# Patient Record
Sex: Male | Born: 1995
Health system: Southern US, Community
[De-identification: ages and names within clinical notes are randomized; demographics above are authoritative.]

## PROBLEM LIST (undated history)

## (undated) DIAGNOSIS — F419 Anxiety disorder, unspecified: Secondary | ICD-10-CM

---
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
## 2018-03-23 ENCOUNTER — Emergency Department (HOSPITAL_COMMUNITY)
Admission: EM | Admit: 2018-03-23 | Discharge: 2018-03-23 | Disposition: A | Discharge status: Home / Self Care | Payer: Self-pay | Attending: Emergency Medicine | Admitting: Emergency Medicine

## 2018-03-23 ENCOUNTER — Encounter (HOSPITAL_COMMUNITY): Payer: Self-pay | Admitting: Emergency Medicine

## 2018-03-23 ENCOUNTER — Other Ambulatory Visit: Payer: Self-pay

## 2018-03-23 DIAGNOSIS — F17200 Nicotine dependence, unspecified, uncomplicated: Secondary | ICD-10-CM | POA: Insufficient documentation

## 2018-03-23 DIAGNOSIS — Y999 Unspecified external cause status: Secondary | ICD-10-CM | POA: Insufficient documentation

## 2018-03-23 DIAGNOSIS — S50862A Insect bite (nonvenomous) of left forearm, initial encounter: Secondary | ICD-10-CM | POA: Insufficient documentation

## 2018-03-23 DIAGNOSIS — W57XXXA Bitten or stung by nonvenomous insect and other nonvenomous arthropods, initial encounter: Secondary | ICD-10-CM | POA: Insufficient documentation

## 2018-03-23 DIAGNOSIS — Y939 Activity, unspecified: Secondary | ICD-10-CM | POA: Insufficient documentation

## 2018-03-23 DIAGNOSIS — Y929 Unspecified place or not applicable: Secondary | ICD-10-CM | POA: Insufficient documentation

## 2018-03-23 DIAGNOSIS — Z79899 Other long term (current) drug therapy: Secondary | ICD-10-CM | POA: Insufficient documentation

## 2018-03-23 MED ORDER — DIPHENHYDRAMINE HCL 25 MG PO CAPS
25.0000 mg | ORAL_CAPSULE | Freq: Once | ORAL | Status: AC
  Administered 2018-03-23: 25 mg via ORAL
  Filled 2018-03-23: qty 1

## 2018-03-23 MED ORDER — DOXYCYCLINE HYCLATE 100 MG PO CAPS: 100.0000 mg | ORAL_CAPSULE | Freq: Two times a day (BID) | ORAL | 0 refills | Status: AC

## 2018-03-23 MED ORDER — DIPHENHYDRAMINE HCL 25 MG PO TABS: 25.0000 mg | ORAL_TABLET | Freq: Four times a day (QID) | ORAL | 0 refills | Status: AC

## 2018-03-23 NOTE — ED Provider Notes (Signed)
[facility] EMERGENCY DEPARTMENT Provider Note   CSN: [id number] Arrival date & time: 03/23/18  0957     History   Chief Complaint Chief Complaint  Patient presents with  . Insect Bite    HPI Erik Zimmerman is a 22 y.o. male who presents with a insect bite on the left forearm.  Past medical history significant for anxiety.  He states that he first noticed the bite mark last night.  He thinks it may be a spider bite but did not see any thing bite him.  Today he noticed that his arm was swollen and red.  It is painful and itchy.  The pain is starting to radiate up his arm.  He denies fever or difficulty moving his arm.  HPI  Past Medical History:  Diagnosis Date  . Anxiety     Patient Active Problem List   Diagnosis Date Noted  . MDD (major depressive disorder), single episode, severe (HCC) 03/26/2013  . ODD (oppositional defiant disorder) 03/26/2013  . ADHD (attention deficit hyperactivity disorder), combined type 03/26/2013  . Polysubstance abuse (HCC) 03/26/2013  . Unresolved grief 03/26/2013  . Suicidal ideation 03/26/2013    History reviewed. No pertinent surgical history.      Home Medications    Prior to Admission medications   Medication Sig Start Date End Date Taking? Authorizing Provider  diphenhydrAMINE (BENADRYL) 25 MG tablet Take 1 tablet (25 mg total) by mouth every 6 (six) hours. 03/23/18   Bethel Born, PA-C  doxycycline (VIBRAMYCIN) 100 MG capsule Take 1 capsule (100 mg total) by mouth 2 (two) times daily. 03/23/18   Bethel Born, PA-C  methylphenidate (CONCERTA) 54 MG CR tablet Take 1 tablet (54 mg total) by mouth daily. 03/31/13   Winson, Louie Bun, NP  mirtazapine (REMERON) 15 MG tablet Take 1 tablet (15 mg total) by mouth at bedtime. 03/31/13   Winson, Louie Bun, NP    Family History No family history on file.  Social History Social History   Tobacco Use  . Smoking status: Current Every Day Smoker  . Smokeless tobacco:  Current User  Substance Use Topics  . Alcohol use: Yes  . Drug use: Yes    Types: Marijuana     Allergies   Latex and Sulfa antibiotics   Review of Systems Review of Systems  Constitutional: Negative for fever.  Skin: Positive for color change and wound.  Neurological: Negative for weakness and numbness.     Physical Exam Updated Vital Signs BP 120/82 (BP Location: Right Arm)   Pulse 78   Temp 98 F (36.7 C) (Oral)   Resp 12   Ht 6' (1.829 m)   Wt 59 kg (130 lb)   SpO2 95%   BMI 17.63 kg/m   Physical Exam  Constitutional: He is oriented to person, place, and time. He appears well-developed and well-nourished. No distress.  HENT:  Head: Normocephalic and atraumatic.  Eyes: Pupils are equal, round, and reactive to light. Conjunctivae are normal. Right eye exhibits no discharge. Left eye exhibits no discharge. No scleral icterus.  Neck: Normal range of motion.  Cardiovascular: Normal rate.  Pulmonary/Chest: Effort normal. No respiratory distress.  Abdominal: He exhibits no distension.  Musculoskeletal:  Small bite wound over the left anterior forearm is approximately 1 cm.  The forearm is mildly erythematous and swollen.  Full range of motion of the elbow, wrist, fingers.  2+ radial pulse  Neurological: He is alert and oriented to person, place, and  time.  Skin: Skin is warm and dry.  Psychiatric: He has a normal mood and affect. His behavior is normal.  Nursing note and vitals reviewed.    ED Treatments / Results  Labs (all labs ordered are listed, but only abnormal results are displayed) Labs Reviewed - No data to display  EKG None  Radiology No results found.  Procedures Procedures (including critical care time)  Medications Ordered in ED Medications  diphenhydrAMINE (BENADRYL) capsule 25 mg (has no administration in time range)     Initial Impression / Assessment and Plan / ED Course  I have reviewed the triage vital signs and the nursing  notes.  Pertinent labs & imaging results that were available during my care of the patient were reviewed by me and considered in my medical decision making (see chart for details).  22 year old male presents with a bite wound and subsequent swelling and erythema over the forearm.  Patient is concerned for infection.  He has not tried any treatments prior to arrival.  His vital signs are normal.  I advised him to elevate and ice the area.  Will treat for allergic reaction and cover for possible infection.  He was given Benadryl and doxycycline and a work note.  Final Clinical Impressions(s) / ED Diagnoses   Final diagnoses:  Insect bite of left forearm, initial encounter    ED Discharge Orders        Ordered    diphenhydrAMINE (BENADRYL) 25 MG tablet  Every 6 hours     03/23/18 1143    doxycycline (VIBRAMYCIN) 100 MG capsule  2 times daily     03/23/18 1143       Bethel Born, PA-C 03/23/18 1150    Loren Racer, MD 03/27/18 1316

## 2018-03-23 NOTE — ED Notes (Signed)
Pt verbalized understanding discharge instructions and denies any further needs or questions at this time. VS stable, ambulatory and steady gait.   

## 2018-03-23 NOTE — Discharge Instructions (Addendum)
Take Ibuprofen for pain and inflammation Keep arm elevated and ice to reduce swelling Take Benadryl for swelling and itching Take Doxycycline twice daily for 5 days. This medicine can make you more sensitive to the sun so please use skin protection and avoid sun exposure during the hottest parts of the day

## 2018-03-23 NOTE — ED Triage Notes (Signed)
Pt. Stated, I got bite by a spider yesterday on my forearm. , left

## 2022-03-02 ENCOUNTER — Other Ambulatory Visit: Payer: Self-pay

## 2022-03-02 ENCOUNTER — Emergency Department (HOSPITAL_COMMUNITY)
Admission: EM | Admit: 2022-03-02 | Discharge: 2022-03-02 | Payer: Medicaid Other | Attending: Emergency Medicine | Admitting: Emergency Medicine

## 2022-03-02 ENCOUNTER — Encounter (HOSPITAL_COMMUNITY): Payer: Self-pay

## 2022-03-02 ENCOUNTER — Emergency Department (HOSPITAL_COMMUNITY): Payer: Medicaid Other

## 2022-03-02 DIAGNOSIS — Z91040 Latex allergy status: Secondary | ICD-10-CM | POA: Diagnosis not present

## 2022-03-02 DIAGNOSIS — Y906 Blood alcohol level of 120-199 mg/100 ml: Secondary | ICD-10-CM | POA: Diagnosis not present

## 2022-03-02 DIAGNOSIS — F10120 Alcohol abuse with intoxication, uncomplicated: Secondary | ICD-10-CM | POA: Insufficient documentation

## 2022-03-02 DIAGNOSIS — Z79899 Other long term (current) drug therapy: Secondary | ICD-10-CM | POA: Insufficient documentation

## 2022-03-02 DIAGNOSIS — R112 Nausea with vomiting, unspecified: Secondary | ICD-10-CM | POA: Diagnosis present

## 2022-03-02 DIAGNOSIS — R519 Headache, unspecified: Secondary | ICD-10-CM | POA: Insufficient documentation

## 2022-03-02 DIAGNOSIS — F10920 Alcohol use, unspecified with intoxication, uncomplicated: Secondary | ICD-10-CM

## 2022-03-02 LAB — COMPREHENSIVE METABOLIC PANEL
ALT: 12 U/L (ref 0–44)
AST: 22 U/L (ref 15–41)
Albumin: 4.1 g/dL (ref 3.5–5.0)
Alkaline Phosphatase: 52 U/L (ref 38–126)
Anion gap: 10 (ref 5–15)
BUN: 9 mg/dL (ref 6–20)
CO2: 22 mmol/L (ref 22–32)
Calcium: 8.5 mg/dL — ABNORMAL LOW (ref 8.9–10.3)
Chloride: 105 mmol/L (ref 98–111)
Creatinine, Ser: 0.84 mg/dL (ref 0.61–1.24)
GFR, Estimated: >60 mL/min (ref >60)
Glucose, Bld: 94 mg/dL (ref 70–99)
Potassium: 3.3 mmol/L — ABNORMAL LOW (ref 3.5–5.1)
Sodium: 137 mmol/L (ref 135–145)
Total Bilirubin: 0.7 mg/dL (ref 0.3–1.2)
Total Protein: 7.3 g/dL (ref 6.5–8.1)

## 2022-03-02 LAB — CBC WITH DIFFERENTIAL/PLATELET
Abs Immature Granulocytes: 0.01 10*3/uL (ref 0.00–0.07)
Basophils Absolute: 0.1 10*3/uL (ref 0.0–0.1)
Basophils Relative: 1 %
Eosinophils Absolute: 0.4 10*3/uL (ref 0.0–0.5)
Eosinophils Relative: 6 %
HCT: 38.6 % — ABNORMAL LOW (ref 39.0–52.0)
Hemoglobin: 13.3 g/dL (ref 13.0–17.0)
Immature Granulocytes: 0 %
Lymphocytes Relative: 43 %
Lymphs Abs: 3 10*3/uL (ref 0.7–4.0)
MCH: 31.1 pg (ref 26.0–34.0)
MCHC: 34.5 g/dL (ref 30.0–36.0)
MCV: 90.4 fL (ref 80.0–100.0)
Monocytes Absolute: 0.4 10*3/uL (ref 0.1–1.0)
Monocytes Relative: 6 %
Neutro Abs: 3 10*3/uL (ref 1.7–7.7)
Neutrophils Relative %: 44 %
Platelets: 230 10*3/uL (ref 150–400)
RBC: 4.27 MIL/uL (ref 4.22–5.81)
RDW: 12.9 % (ref 11.5–15.5)
WBC: 6.8 10*3/uL (ref 4.0–10.5)
nRBC: 0 % (ref 0.0–0.2)

## 2022-03-02 LAB — RAPID URINE DRUG SCREEN, HOSP PERFORMED
Amphetamines: NOT DETECTED
Barbiturates: NOT DETECTED
Benzodiazepines: POSITIVE — AB
Cocaine: NOT DETECTED
Opiates: NOT DETECTED
Tetrahydrocannabinol: POSITIVE — AB

## 2022-03-02 LAB — CBG MONITORING, ED: Glucose-Capillary: 97 mg/dL (ref 70–99)

## 2022-03-02 LAB — SALICYLATE LEVEL: Salicylate Lvl: <7 mg/dL — ABNORMAL LOW (ref 7.0–30.0)

## 2022-03-02 LAB — ETHANOL: Alcohol, Ethyl (B): 175 mg/dL — ABNORMAL HIGH (ref <10)

## 2022-03-02 LAB — ACETAMINOPHEN LEVEL: Acetaminophen (Tylenol), Serum: <10 ug/mL — ABNORMAL LOW (ref 10–30)

## 2022-03-02 MED ORDER — NALOXONE HCL 2 MG/2ML IJ SOSY
1.0000 mg | PREFILLED_SYRINGE | Freq: Once | INTRAMUSCULAR | Status: AC
  Administered 2022-03-02: 1 mg via INTRAVENOUS
  Filled 2022-03-02: qty 2

## 2022-03-02 MED ORDER — ONDANSETRON HCL 4 MG/2ML IJ SOLN
4.0000 mg | Freq: Once | INTRAMUSCULAR | Status: AC
  Administered 2022-03-02: 4 mg via INTRAVENOUS
  Filled 2022-03-02: qty 2

## 2022-03-02 MED ORDER — NALOXONE HCL 4 MG/0.1ML NA LIQD: 0.4000 mg | Freq: Once | NASAL | Status: DC

## 2022-03-02 NOTE — ED Triage Notes (Signed)
Patient BIB [facility] from jail. Began vomiting at the jail, was highly intoxicated, banging his head on the walls and telling the police to shoot him. EMS gave 5mg  Versed IM en route. ?

## 2022-03-02 NOTE — ED Provider Notes (Signed)
?[facility]-EMERGENCY DEPT ?Provider Note ? ? ?CSN: [id number] ?Arrival date & time: 03/02/22  0147 ? ?  ? ?History ? ?Chief Complaint  ?Patient presents with  ? Emesis  ? ? ?Erik Zimmerman is a 26 y.o. male. ? ?The history is provided by the patient, the EMS personnel and the police.  ?Emesis ?Severity:  Moderate ?Timing:  Sporadic ?Quality:  Stomach contents ?Progression:  Unchanged ?Chronicity:  New ?Recent urination:  Normal ?Context: not post-tussive   ?Relieved by:  Nothing ?Worsened by:  Nothing ?Ineffective treatments:  None tried ?Associated symptoms: no abdominal pain and no fever   ?Risk factors: alcohol use   ?Patient with a h/o polysubstance abuse who was taken to jail by police for warrants presents after he was in custody and then banged his head on the wall.  Was given versed by EMS.  Not suicidal on arrival.   ?  ? ?Home Medications ?Prior to Admission medications   ?Medication Sig Start Date End Date Taking? Authorizing Provider  ?diphenhydrAMINE (BENADRYL) 25 MG tablet Take 1 tablet (25 mg total) by mouth every 6 (six) hours. 03/23/18   Bethel Born, PA-C  ?doxycycline (VIBRAMYCIN) 100 MG capsule Take 1 capsule (100 mg total) by mouth 2 (two) times daily. 03/23/18   Bethel Born, PA-C  ?methylphenidate (CONCERTA) 54 MG CR tablet Take 1 tablet (54 mg total) by mouth daily. 03/31/13   Winson, Louie Bun, NP  ?mirtazapine (REMERON) 15 MG tablet Take 1 tablet (15 mg total) by mouth at bedtime. 03/31/13   Winson, Louie Bun, NP  ?   ? ?Allergies    ?Latex and Sulfa antibiotics   ? ?Review of Systems   ?Review of Systems  ?Constitutional:  Negative for fever.  ?HENT:  Negative for facial swelling.   ?Eyes:  Negative for redness.  ?Respiratory:  Negative for wheezing and stridor.   ?Cardiovascular:  Negative for chest pain.  ?Gastrointestinal:  Positive for vomiting. Negative for abdominal pain.  ?All other systems reviewed and are negative. ? ?Physical Exam ?Updated Vital Signs ?BP 123/83  (BP Location: Right Arm)   Pulse 75   Temp 97.6 ?F (36.4 ?C) (Oral)   Resp 20   SpO2 97%  ?Physical Exam ?Vitals and nursing note reviewed. Exam conducted with a chaperone present.  ?Constitutional:   ?   Comments: Sleeping but easily arousable  ?HENT:  ?   Head: Normocephalic and atraumatic.  ?   Nose: Nose normal.  ?   Mouth/Throat:  ?   Mouth: Mucous membranes are moist.  ?Eyes:  ?   Conjunctiva/sclera: Conjunctivae normal.  ?   Pupils: Pupils are equal, round, and reactive to light.  ?Cardiovascular:  ?   Rate and Rhythm: Normal rate and regular rhythm.  ?   Pulses: Normal pulses.  ?   Heart sounds: Normal heart sounds.  ?Pulmonary:  ?   Effort: Pulmonary effort is normal.  ?   Breath sounds: Normal breath sounds.  ?Abdominal:  ?   General: Abdomen is flat. Bowel sounds are normal.  ?   Palpations: Abdomen is soft.  ?   Tenderness: There is no abdominal tenderness. There is no guarding.  ?Musculoskeletal:     ?   General: Normal range of motion.  ?   Cervical back: Normal range of motion and neck supple.  ?Skin: ?   General: Skin is warm and dry.  ?   Capillary Refill: Capillary refill takes less than 2 seconds.  ?Neurological:  ?  General: No focal deficit present.  ?   Deep Tendon Reflexes: Reflexes normal.  ?Psychiatric:     ?   Thought Content: Thought content normal. Thought content does not include homicidal or suicidal ideation.  ? ? ?ED Results / Procedures / Treatments   ?Labs ?(all labs ordered are listed, but only abnormal results are displayed) ?Results for orders placed or performed during the hospital encounter of 03/02/22  ?Comprehensive metabolic panel  ?Result Value Ref Range  ? Sodium 137 135 - 145 mmol/L  ? Potassium 3.3 (L) 3.5 - 5.1 mmol/L  ? Chloride 105 98 - 111 mmol/L  ? CO2 22 22 - 32 mmol/L  ? Glucose, Bld 94 70 - 99 mg/dL  ? BUN 9 6 - 20 mg/dL  ? Creatinine, Ser 0.84 0.61 - 1.24 mg/dL  ? Calcium 8.5 (L) 8.9 - 10.3 mg/dL  ? Total Protein 7.3 6.5 - 8.1 g/dL  ? Albumin 4.1 3.5 - 5.0  g/dL  ? AST 22 15 - 41 U/L  ? ALT 12 0 - 44 U/L  ? Alkaline Phosphatase 52 38 - 126 U/L  ? Total Bilirubin 0.7 0.3 - 1.2 mg/dL  ? GFR, Estimated >60 >60 mL/min  ? Anion gap 10 5 - 15  ?Salicylate level  ?Result Value Ref Range  ? Salicylate Lvl <7.0 (L) 7.0 - 30.0 mg/dL  ?Acetaminophen level  ?Result Value Ref Range  ? Acetaminophen (Tylenol), Serum <10 (L) 10 - 30 ug/mL  ?Ethanol  ?Result Value Ref Range  ? Alcohol, Ethyl (B) 175 (H) <10 mg/dL  ?Urine rapid drug screen (hosp performed)  ?Result Value Ref Range  ? Opiates NONE DETECTED NONE DETECTED  ? Cocaine NONE DETECTED NONE DETECTED  ? Benzodiazepines POSITIVE (A) NONE DETECTED  ? Amphetamines NONE DETECTED NONE DETECTED  ? Tetrahydrocannabinol POSITIVE (A) NONE DETECTED  ? Barbiturates NONE DETECTED NONE DETECTED  ?CBC WITH DIFFERENTIAL  ?Result Value Ref Range  ? WBC 6.8 4.0 - 10.5 K/uL  ? RBC 4.27 4.22 - 5.81 MIL/uL  ? Hemoglobin 13.3 13.0 - 17.0 g/dL  ? HCT 38.6 (L) 39.0 - 52.0 %  ? MCV 90.4 80.0 - 100.0 fL  ? MCH 31.1 26.0 - 34.0 pg  ? MCHC 34.5 30.0 - 36.0 g/dL  ? RDW 12.9 11.5 - 15.5 %  ? Platelets 230 150 - 400 K/uL  ? nRBC 0.0 0.0 - 0.2 %  ? Neutrophils Relative % 44 %  ? Neutro Abs 3.0 1.7 - 7.7 K/uL  ? Lymphocytes Relative 43 %  ? Lymphs Abs 3.0 0.7 - 4.0 K/uL  ? Monocytes Relative 6 %  ? Monocytes Absolute 0.4 0.1 - 1.0 K/uL  ? Eosinophils Relative 6 %  ? Eosinophils Absolute 0.4 0.0 - 0.5 K/uL  ? Basophils Relative 1 %  ? Basophils Absolute 0.1 0.0 - 0.1 K/uL  ? Immature Granulocytes 0 %  ? Abs Immature Granulocytes 0.01 0.00 - 0.07 K/uL  ?CBG monitoring, ED  ?Result Value Ref Range  ? Glucose-Capillary 97 70 - 99 mg/dL  ? ?CT Head Wo Contrast ? ?Result Date: 03/02/2022 ?CLINICAL DATA:  ETOH, nausea/vomiting EXAM: CT HEAD WITHOUT CONTRAST TECHNIQUE: Contiguous axial images were obtained from the base of the skull through the vertex without intravenous contrast. RADIATION DOSE REDUCTION: This exam was performed according to the departmental  dose-optimization program which includes automated exposure control, adjustment of the mA and/or kV according to patient size and/or use of iterative reconstruction technique. COMPARISON:  07/23/2021 FINDINGS: Brain:  No evidence of acute infarction, hemorrhage, hydrocephalus, extra-axial collection or mass lesion/mass effect. Vascular: No hyperdense vessel or unexpected calcification. Skull: Normal. Negative for fracture or focal lesion. Sinuses/Orbits: The visualized paranasal sinuses are essentially clear. The mastoid air cells are unopacified. Other: None. IMPRESSION: Normal head CT. Electronically Signed   By: Charline BillsSriyesh  Krishnan M.D.   On: 03/02/2022 03:49   ? ? ? ?EKG ?None ? ?Radiology ?CT Head Wo Contrast ? ?Result Date: 03/02/2022 ?CLINICAL DATA:  ETOH, nausea/vomiting EXAM: CT HEAD WITHOUT CONTRAST TECHNIQUE: Contiguous axial images were obtained from the base of the skull through the vertex without intravenous contrast. RADIATION DOSE REDUCTION: This exam was performed according to the departmental dose-optimization program which includes automated exposure control, adjustment of the mA and/or kV according to patient size and/or use of iterative reconstruction technique. COMPARISON:  07/23/2021 FINDINGS: Brain: No evidence of acute infarction, hemorrhage, hydrocephalus, extra-axial collection or mass lesion/mass effect. Vascular: No hyperdense vessel or unexpected calcification. Skull: Normal. Negative for fracture or focal lesion. Sinuses/Orbits: The visualized paranasal sinuses are essentially clear. The mastoid air cells are unopacified. Other: None. IMPRESSION: Normal head CT. Electronically Signed   By: Charline BillsSriyesh  Krishnan M.D.   On: 03/02/2022 03:49   ? ?Procedures ?Procedures  ? ? ?Medications Ordered in ED ?Medications  ?ondansetron (ZOFRAN) injection 4 mg (4 mg Intravenous Given 03/02/22 0259)  ?naloxone [facility](NARCAN) injection 1 mg (1 mg Intravenous Given 03/02/22 0506)  ? ? ?ED Course/ Medical Decision  Making/ A&P ?  ?                        ?Medical Decision Making ?BIB police following vomiting and banging his head on the wall while intoxicated at the jail while in custody, they were required to bring him in they

## 2022-03-02 NOTE — ED Notes (Signed)
Pt a&o x4. Patient was agitated and was belligerent; [employer] at patient bedside. Pt restrained in hand cuffs and escorted out with discharge paperwork in officers hands. ?

## 2023-04-25 IMAGING — CT CT HEAD W/O CM
3 series · 16 of 47 positions shown, 19 images · non-contrast
Comparison: 07/23/2021

CLINICAL DATA: ETOH, nausea/vomiting



[Series 2: head wo · axial · 0.47mm/px · z∈[-102,+23]mm · 10 of 31 slices shown, 13 images]
[im 3/31  brain]
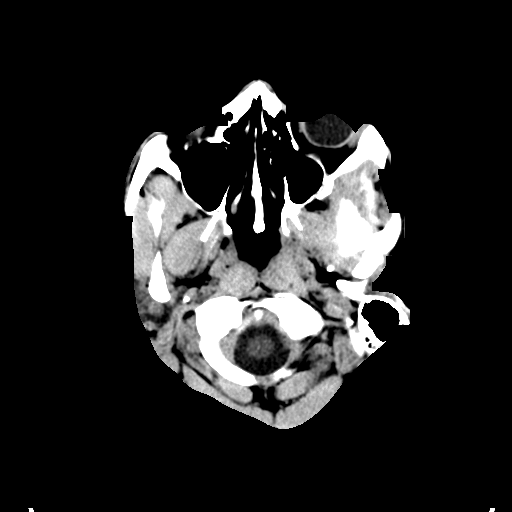
[im 3/31  bone]
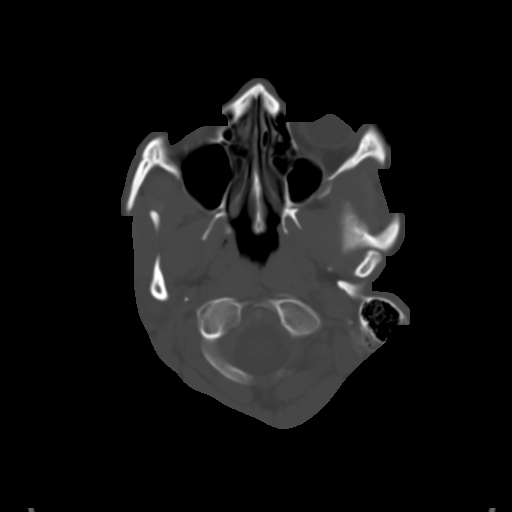
[im 6/31  brain]
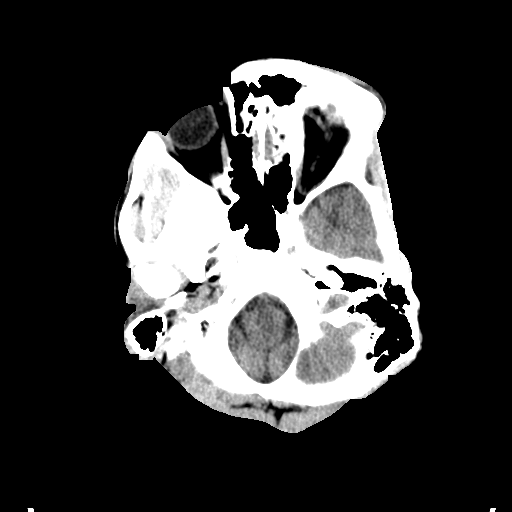
[im 9/31  brain]
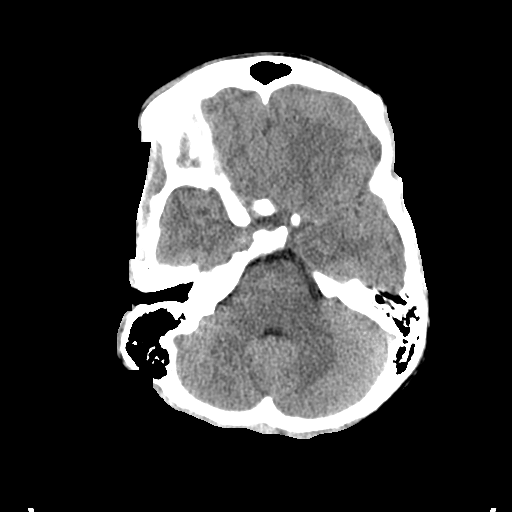
[im 11/31  brain]
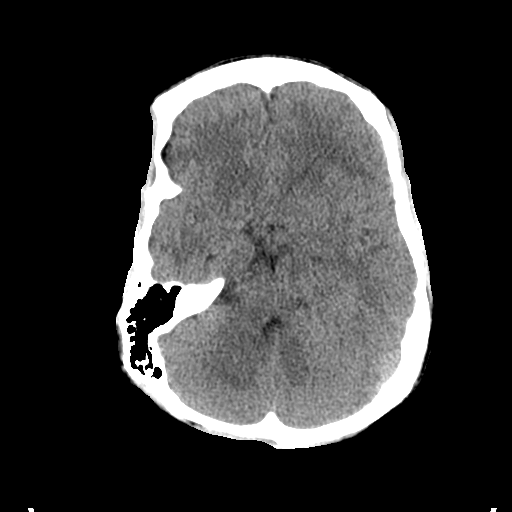
[im 14/31  brain]
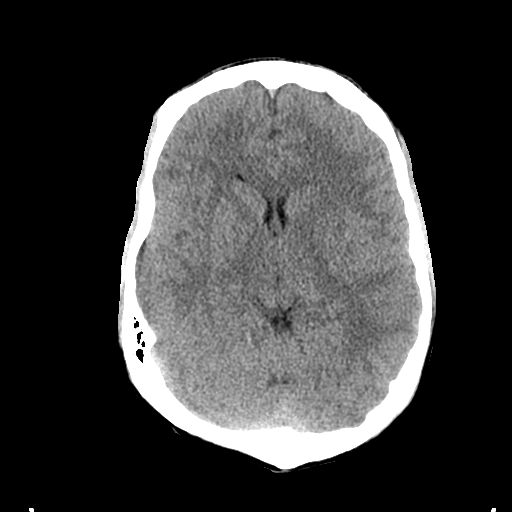
[im 14/31  bone]
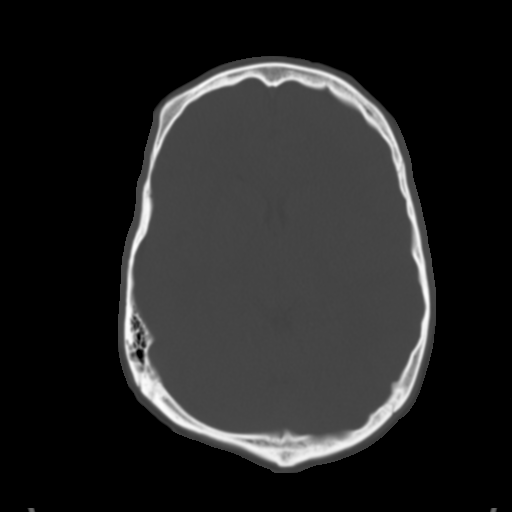
[im 17/31  brain]
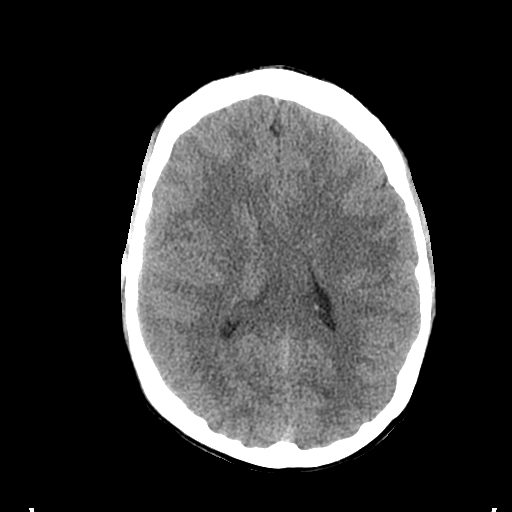
[im 20/31  brain]
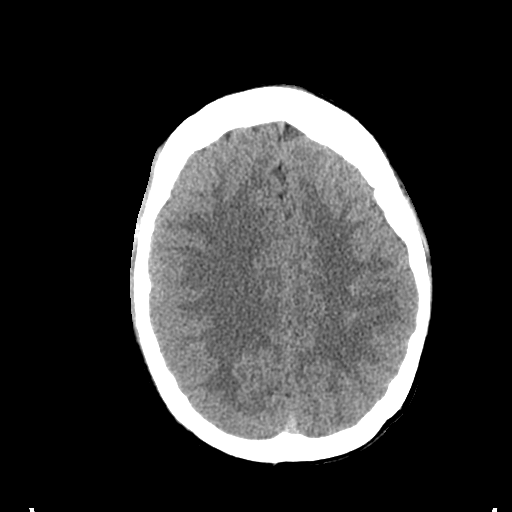
[im 23/31  brain]
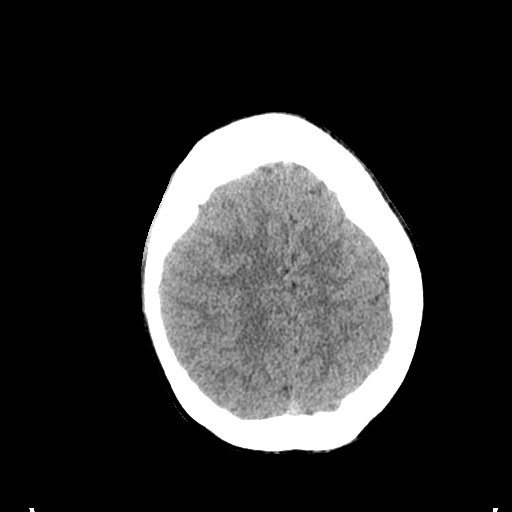
[im 25/31  brain]
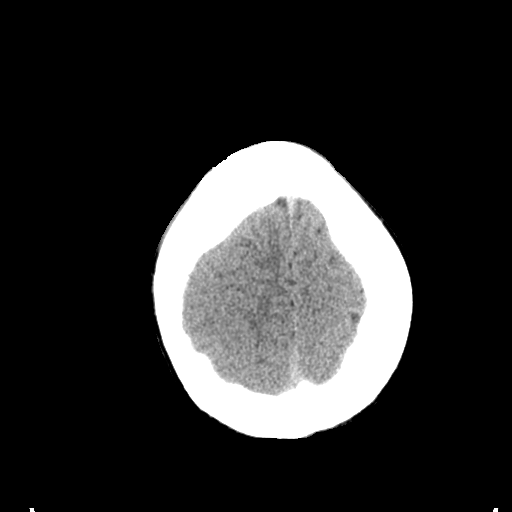
[im 25/31  bone]
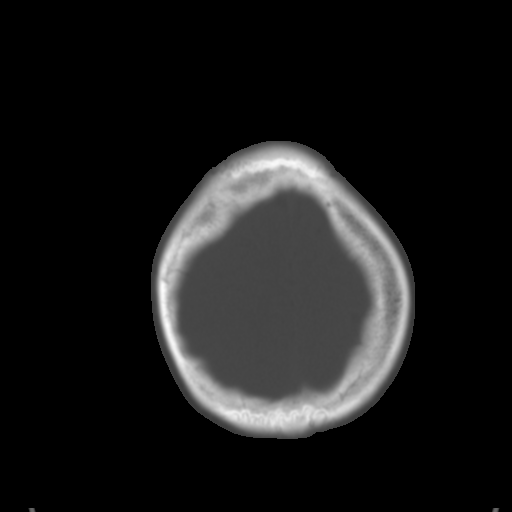
[im 28/31  brain]
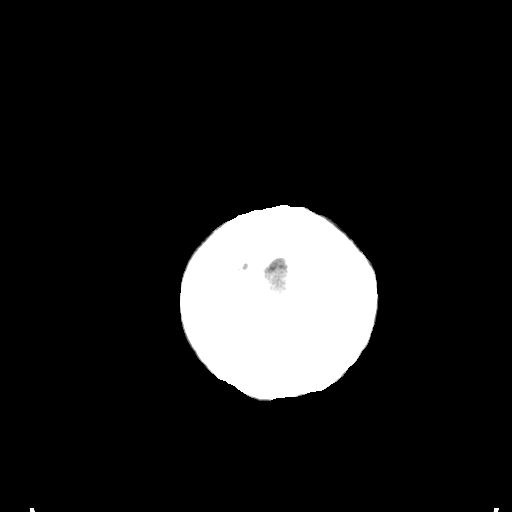

[Series 4: coronal soft tissue · coronal · 0.31mm/px · 3 of 68 slices shown]
[im 23/68  brain]
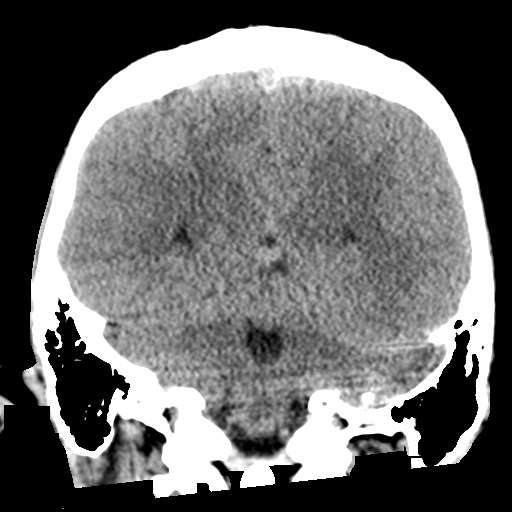
[im 30/68  brain]
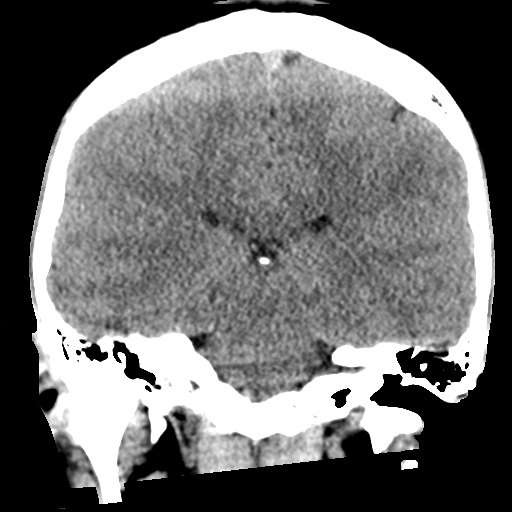
[im 38/68  brain]
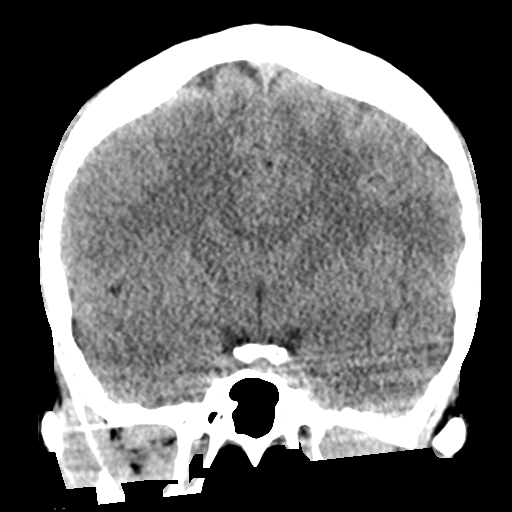

[Series 5: sagittal soft tissue · sagittal · 0.32mm/px · 3 of 53 slices shown]
[im 18/53  brain]
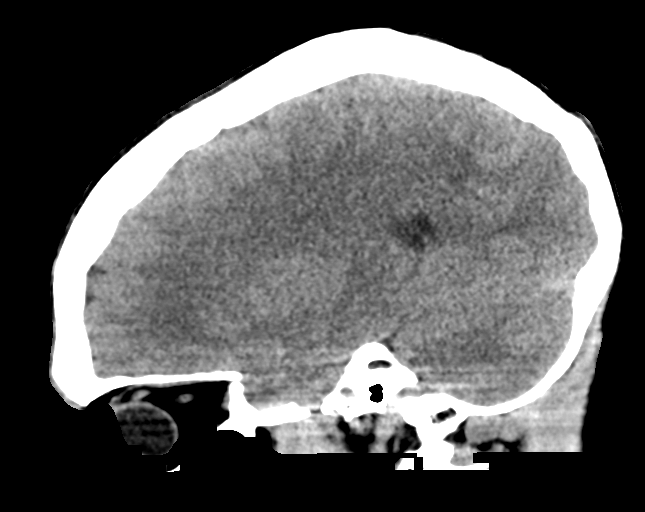
[im 27/53  brain]
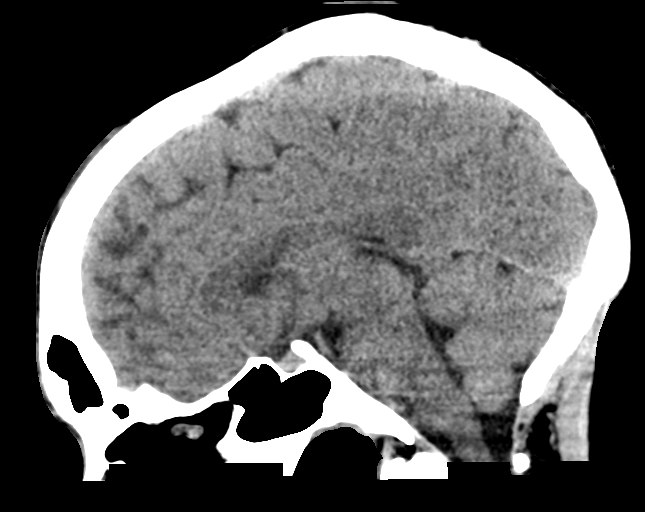
[im 35/53  brain]
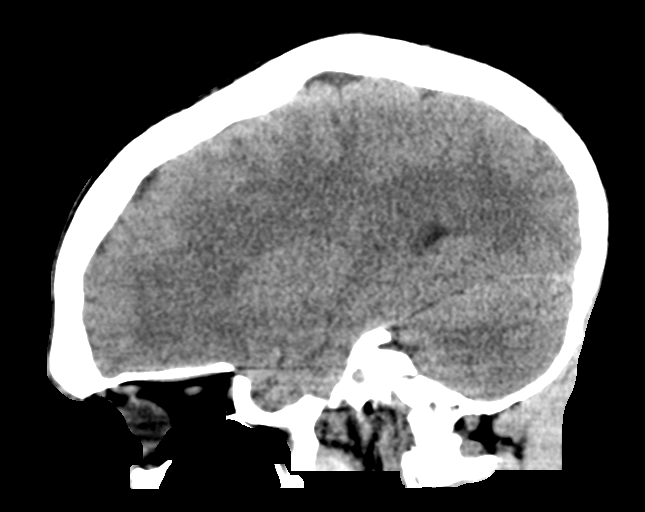

[16 of 47 positions shown; findings below may reference images not displayed]

FINDINGS: Brain: No evidence of acute infarction, hemorrhage, hydrocephalus,
extra-axial collection or mass lesion/mass effect.

Vascular: No hyperdense vessel or unexpected calcification.

Skull: Normal. Negative for fracture or focal lesion.

Sinuses/Orbits: The visualized paranasal sinuses are essentially
clear. The mastoid air cells are unopacified.

Other: None.
IMPRESSION: Normal head CT.

## 2023-06-08 ENCOUNTER — Encounter (HOSPITAL_BASED_OUTPATIENT_CLINIC_OR_DEPARTMENT_OTHER): Payer: Self-pay

## 2023-06-08 ENCOUNTER — Emergency Department (HOSPITAL_BASED_OUTPATIENT_CLINIC_OR_DEPARTMENT_OTHER)
Admission: EM | Admit: 2023-06-08 | Discharge: 2023-06-08 | Disposition: A | Discharge status: Home / Self Care | Payer: MEDICAID | Attending: Emergency Medicine | Admitting: Emergency Medicine

## 2023-06-08 DIAGNOSIS — H02841 Edema of right upper eyelid: Secondary | ICD-10-CM | POA: Diagnosis not present

## 2023-06-08 DIAGNOSIS — R229 Localized swelling, mass and lump, unspecified: Secondary | ICD-10-CM

## 2023-06-08 DIAGNOSIS — R220 Localized swelling, mass and lump, head: Secondary | ICD-10-CM | POA: Diagnosis not present

## 2023-06-08 DIAGNOSIS — T63441A Toxic effect of venom of bees, accidental (unintentional), initial encounter: Secondary | ICD-10-CM | POA: Diagnosis not present

## 2023-06-08 DIAGNOSIS — T63444A Toxic effect of venom of bees, undetermined, initial encounter: Secondary | ICD-10-CM

## 2023-06-08 DIAGNOSIS — Z91040 Latex allergy status: Secondary | ICD-10-CM | POA: Diagnosis not present

## 2023-06-08 DIAGNOSIS — T7840XA Allergy, unspecified, initial encounter: Secondary | ICD-10-CM | POA: Insufficient documentation

## 2023-06-08 DIAGNOSIS — R209 Unspecified disturbances of skin sensation: Secondary | ICD-10-CM | POA: Insufficient documentation

## 2023-06-08 MED ORDER — FAMOTIDINE IN NACL 20-0.9 MG/50ML-% IV SOLN
20.0000 mg | Freq: Once | INTRAVENOUS | Status: AC
  Administered 2023-06-08: 20 mg via INTRAVENOUS
  Filled 2023-06-08: qty 50

## 2023-06-08 MED ORDER — EPINEPHRINE 0.3 MG/0.3ML IJ SOAJ
0.3000 mg | Freq: Once | INTRAMUSCULAR | Status: AC
Start: 2023-06-08 — End: 2023-06-08
  Administered 2023-06-08: 0.3 mg via INTRAMUSCULAR
  Filled 2023-06-08: qty 0.3

## 2023-06-08 MED ORDER — SODIUM CHLORIDE 0.9 % IV BOLUS
500.0000 mL | Freq: Once | INTRAVENOUS | Status: AC
  Administered 2023-06-08: 500 mL via INTRAVENOUS

## 2023-06-08 MED ORDER — SODIUM CHLORIDE 0.9 % IV SOLN: 40.0000 mg | Freq: Once | INTRAVENOUS | Status: DC

## 2023-06-08 MED ORDER — METHYLPREDNISOLONE SODIUM SUCC 125 MG IJ SOLR
125.0000 mg | Freq: Once | INTRAMUSCULAR | Status: AC
Start: 2023-06-08 — End: 2023-06-08
  Administered 2023-06-08: 125 mg via INTRAVENOUS
  Filled 2023-06-08: qty 2

## 2023-06-08 MED ORDER — DIPHENHYDRAMINE HCL 50 MG/ML IJ SOLN
50.0000 mg | Freq: Once | INTRAMUSCULAR | Status: AC
  Administered 2023-06-08: 50 mg via INTRAVENOUS
  Filled 2023-06-08: qty 1

## 2023-06-08 MED ORDER — EPINEPHRINE 0.3 MG/0.3ML IJ SOAJ: 0.3000 mg | INTRAMUSCULAR | 0 refills | Status: AC | PRN

## 2023-06-08 MED ORDER — CETIRIZINE HCL 10 MG PO TABS: 10.0000 mg | ORAL_TABLET | Freq: Every day | ORAL | 0 refills | Status: AC | PRN

## 2023-06-08 MED ORDER — PREDNISONE 20 MG PO TABS: 40.0000 mg | ORAL_TABLET | Freq: Every day | ORAL | 0 refills | Status: AC

## 2023-06-08 NOTE — ED Provider Notes (Signed)
[place] EMERGENCY DEPARTMENT AT [facility] Provider Note   CSN: [id number] Arrival date & time: 06/08/23  1112     History  Chief Complaint  Patient presents with   Allergic Reaction    Erik Zimmerman is a 27 y.o. male.   Allergic Reaction   27 year old male presents emergency department with complaints of bee sting.  Patient states that he was stung on the forehead around 9 PM last night by a yellowjacket or wasp.  States that he had some localized swelling as well as some swelling of his right eyelid when he went to bed.  States he took some Benadryl which improved of symptoms initially.  He woke up this morning with swelling to bilateral eyelids with only minimally being able to see out of his right eye.  Reports some tingling type sensation of his upper lip but denies any lip swelling, tongue swelling, difficulty breathing, difficulty swallowing, nausea, vomiting.  Denies history of similar symptoms in the past.  Has taken no medication for his symptoms today.  Past medical history significant for polysubstance use, ADHD, ODD, MDD  Home Medications Prior to Admission medications   Medication Sig Start Date End Date Taking? Authorizing Provider  cetirizine (ZYRTEC ALLERGY) 10 MG tablet Take 1 tablet (10 mg total) by mouth daily as needed for allergies (swelling/itch). 06/08/23  Yes Sherian Maroon A, PA  EPINEPHrine 0.3 mg/0.3 mL IJ SOAJ injection Inject 0.3 mg into the muscle as needed for anaphylaxis. 06/08/23  Yes Sherian Maroon A, PA  predniSONE (DELTASONE) 20 MG tablet Take 2 tablets (40 mg total) by mouth daily with breakfast for 4 days. 06/09/23 06/13/23 Yes Sherian Maroon A, PA  diphenhydrAMINE (BENADRYL) 25 MG tablet Take 1 tablet (25 mg total) by mouth every 6 (six) hours. 03/23/18   Bethel Born, PA-C  doxycycline (VIBRAMYCIN) 100 MG capsule Take 1 capsule (100 mg total) by mouth 2 (two) times daily. 03/23/18   Bethel Born, PA-C  methylphenidate  (CONCERTA) 54 MG CR tablet Take 1 tablet (54 mg total) by mouth daily. 03/31/13   Winson, Louie Bun, NP  mirtazapine (REMERON) 15 MG tablet Take 1 tablet (15 mg total) by mouth at bedtime. 03/31/13   Winson, Louie Bun, NP      Allergies    Latex and Sulfa antibiotics    Review of Systems   Review of Systems  All other systems reviewed and are negative.   Physical Exam Updated Vital Signs BP 121/71   Pulse (!) 51   Temp 97.9 F (36.6 C) (Oral)   Resp 18   Ht 6' (1.829 m)   Wt 68 kg   SpO2 97%   BMI 20.34 kg/m  Physical Exam Vitals and nursing note reviewed.  Constitutional:      General: He is not in acute distress.    Appearance: He is well-developed.  HENT:     Head: Normocephalic.     Comments: Punctate scabbed area noted over just left of midline on forehead where sting reportedly occurred.  Swelling noted to bilateral upper and lower eyelids with left eye being completely closed and only minimally being able to serve right eye.  No obvious oropharyngeal swelling.  No auscultatory stridor of neck.    Nose: Nose normal.     Mouth/Throat:     Mouth: Mucous membranes are moist.     Pharynx: Oropharynx is clear.  Eyes:     Conjunctiva/sclera: Conjunctivae normal.  Cardiovascular:     Rate  and Rhythm: Normal rate and regular rhythm.     Heart sounds: No murmur heard. Pulmonary:     Effort: Pulmonary effort is normal. No respiratory distress.     Breath sounds: Normal breath sounds.  Abdominal:     Palpations: Abdomen is soft.     Tenderness: There is no abdominal tenderness.  Musculoskeletal:        General: No swelling.     Cervical back: Neck supple.  Skin:    General: Skin is warm and dry.     Capillary Refill: Capillary refill takes less than 2 seconds.  Neurological:     Mental Status: He is alert.  Psychiatric:        Mood and Affect: Mood normal.     ED Results / Procedures / Treatments   Labs (all labs ordered are listed, but only abnormal results are  displayed) Labs Reviewed - No data to display  EKG None  Radiology No results found.  Procedures Procedures    Medications Ordered in ED Medications  EPINEPHrine (EPI-PEN) injection 0.3 mg (0.3 mg Intramuscular Given 06/08/23 1150)  diphenhydrAMINE (BENADRYL) injection 50 mg (50 mg Intravenous Given 06/08/23 1151)  methylPREDNISolone sodium succinate (SOLU-MEDROL) 125 mg/2 mL injection 125 mg (125 mg Intravenous Given 06/08/23 1151)  sodium chloride 0.9 % bolus 500 mL (0 mLs Intravenous Stopped 06/08/23 1300)  famotidine (PEPCID) IVPB 20 mg premix (0 mg Intravenous Stopped 06/08/23 1241)    Followed by  famotidine (PEPCID) IVPB 20 mg premix (0 mg Intravenous Stopped 06/08/23 1300)    ED Course/ Medical Decision Making/ A&P Clinical Course as of 06/08/23 1558  Sat Jun 08, 2023  1341 Reevaluation of the patient showed improvement of symptoms slightly.  Patient noted resolution of tingling type sensation of lips.  Swelling around bilateral eyes slightly improved. [CR]    Clinical Course User Index [CR] Peter Garter, PA                                 Medical Decision Making Risk OTC drugs. Prescription drug management.   This patient presents to the ED for concern of bee sting, facial swelling, this involves an extensive number of treatment options, and is a complaint that carries with it a high risk of complications and morbidity.  The differential diagnosis includes localized skin reaction, angioedema, anaphylaxis   Co morbidities that complicate the patient evaluation  See HPI   Additional history obtained:  Additional history obtained from EMR External records from outside source obtained and reviewed including hospital records   Lab Tests:  N/a   Imaging Studies ordered:  N/a   Cardiac Monitoring: / EKG:  The patient was maintained on a cardiac monitor.  I personally viewed and interpreted the cardiac monitored which showed an underlying rhythm of: Sinus  rhythm   Consultations Obtained:  I requested consultation with attending physician Dr. Dalene Seltzer who independently evaluated the patient and was in agreement with treatment plan going forward  Problem List / ED Course / Critical interventions / Medication management  Bee sting, localized soft tissue swelling I ordered medication including Pepcid, Benadryl, Solu-Medrol, epinephrine  Reevaluation of the patient after these medicines showed that the patient improved I have reviewed the patients home medicines and have made adjustments as needed   Social Determinants of Health:  Denies tobacco, illicit drug use   Test / Admission - Considered:  Bee sting, localized soft tissue swelling  Vitals signs within normal range and stable throughout visit. 27 year old male presents emergency department with complaints of facial swelling after stung by an insect last night around 9 PM.  Patient on exam with pretty significant facial swelling of bilateral upper and lower eyelids with near occlusion of bilateral eyes without any swelling of lips, tongue or signs of respiratory compromise.  Initially, patient swelling seemed likely secondary to localized reaction from where insect had stung him on the lower mid part of his forehead but could not rule out possible angioedema.  Treated as such with epinephrine, Solu-Medrol, antihistamine and noted some improvement of symptoms.  Patient observed for around 4 hours in the ED with continuing improving symptoms.  I suspect patient's symptoms are likely more related to localized swelling from insect bite as opposed to angioedema.  Will send home with prednisone, antihistamine and EpiPen to use if concern for anaphylaxis/angioedema develops.  Recommend reevaluation within 24 to 48 hours for reassessment of symptoms.  Treatment plan discussed at length with patient and he acknowledged understand was agreeable to said plan.  Patient overall well-appearing, afebrile in  no acute distress. Worrisome signs and symptoms were discussed with the patient, and the patient acknowledged understanding to return to the ED if noticed. Patient was stable upon discharge.          Final Clinical Impression(s) / ED Diagnoses Final diagnoses:  Localized soft tissue swelling  Bee sting reaction, undetermined intent, initial encounter    Rx / DC Orders ED Discharge Orders          Ordered    predniSONE (DELTASONE) 20 MG tablet  Daily with breakfast        06/08/23 1434    cetirizine (ZYRTEC ALLERGY) 10 MG tablet  Daily PRN        06/08/23 1434    EPINEPHrine 0.3 mg/0.3 mL IJ SOAJ injection  As needed        06/08/23 1434              Peter Garter, [place] 06/08/23 1558    Alvira Monday, MD 06/08/23 2246

## 2023-06-08 NOTE — ED Notes (Signed)
Patient calling for ride. Left message with his mother.

## 2023-06-08 NOTE — ED Triage Notes (Signed)
The patient was stung by a bee last night on his forehead. Now his eyes are swollen shut. He is also having a shaking feeling. He does not appear to be in any respiratory distress at this time.

## 2023-06-08 NOTE — Discharge Instructions (Addendum)
As discussed, we will send you home with steroids to take daily for the next few days as well as allergy medicine called Zyrtec to take in the morning.  He may continue take Benadryl at night as needed for itch/swelling.  Continue to ice your face 20 to 30 minutes on with 30 to 40 minutes off to help with swelling.  Will also send you home with an EpiPen to use as needed if you develop worsening swelling, difficulty breathing, tongue swelling, lip swelling.  If you use EpiPen, please return immediately to the emergency department.
